# Patient Record
Sex: Male | Born: 1990 | Race: Black or African American | Hispanic: No | Marital: Single | Smoking: Current every day smoker
Health system: Southern US, Community
[De-identification: ages and names within clinical notes are randomized; demographics above are authoritative.]

---
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
## 2010-07-07 LAB — URINE CULTURE
Colony Count: NO GROWTH
Culture  Setup Time: 201109031130
Culture: NO GROWTH

## 2010-07-07 LAB — COMPREHENSIVE METABOLIC PANEL
ALT: 28 U/L (ref 0–53)
AST: 24 U/L (ref 0–37)
Albumin: 4.1 g/dL (ref 3.5–5.2)
Alkaline Phosphatase: 135 U/L — ABNORMAL HIGH (ref 39–117)
BUN: 7 mg/dL (ref 6–23)
CO2: 28 mEq/L (ref 19–32)
Calcium: 9.4 mg/dL (ref 8.4–10.5)
Chloride: 104 mEq/L (ref 96–112)
Creatinine, Ser: 0.9 mg/dL (ref 0.4–1.5)
GFR calc Af Amer: >60 mL/min (ref >60)
GFR calc non Af Amer: >60 mL/min (ref >60)
Glucose, Bld: 97 mg/dL (ref 70–99)
Potassium: 3.7 mEq/L (ref 3.5–5.1)
Sodium: 139 mEq/L (ref 135–145)
Total Bilirubin: 0.3 mg/dL (ref 0.3–1.2)
Total Protein: 6.7 g/dL (ref 6.0–8.3)

## 2010-07-07 LAB — DIFFERENTIAL
Basophils Absolute: 0 10*3/uL (ref 0.0–0.1)
Basophils Relative: 0 % (ref 0–1)
Eosinophils Absolute: 0.1 10*3/uL (ref 0.0–0.7)
Eosinophils Relative: 2 % (ref 0–5)
Lymphocytes Relative: 52 % — ABNORMAL HIGH (ref 12–46)
Lymphs Abs: 4.2 10*3/uL — ABNORMAL HIGH (ref 0.7–4.0)
Monocytes Absolute: 0.5 10*3/uL (ref 0.1–1.0)
Monocytes Relative: 6 % (ref 3–12)
Neutro Abs: 3.2 10*3/uL (ref 1.7–7.7)
Neutrophils Relative %: 40 % — ABNORMAL LOW (ref 43–77)

## 2010-07-07 LAB — CBC
HCT: 42.7 % (ref 39.0–52.0)
Hemoglobin: 14.3 g/dL (ref 13.0–17.0)
MCH: 31 pg (ref 26.0–34.0)
MCHC: 33.5 g/dL (ref 30.0–36.0)
MCV: 92.4 fL (ref 78.0–100.0)
Platelets: 356 10*3/uL (ref 150–400)
RBC: 4.62 MIL/uL (ref 4.22–5.81)
RDW: 13.3 % (ref 11.5–15.5)
WBC: 8.1 10*3/uL (ref 4.0–10.5)

## 2010-07-07 LAB — URINALYSIS, ROUTINE W REFLEX MICROSCOPIC
Bilirubin Urine: NEGATIVE
Glucose, UA: NEGATIVE mg/dL
Hgb urine dipstick: NEGATIVE
Ketones, ur: NEGATIVE mg/dL
Nitrite: NEGATIVE
Protein, ur: NEGATIVE mg/dL
Specific Gravity, Urine: 1.03 (ref 1.005–1.030)
Urobilinogen, UA: 1 mg/dL (ref 0.0–1.0)
pH: 6 (ref 5.0–8.0)

## 2010-07-07 LAB — LIPASE, BLOOD: Lipase: 21 U/L (ref 11–59)

## 2010-07-07 LAB — URINE MICROSCOPIC-ADD ON

## 2011-06-21 ENCOUNTER — Emergency Department (HOSPITAL_COMMUNITY)
Admission: EM | Admit: 2011-06-21 | Discharge: 2011-06-21 | Disposition: A | Discharge status: Home / Self Care | Payer: Medicaid Other | Attending: Emergency Medicine | Admitting: Emergency Medicine

## 2011-06-21 ENCOUNTER — Encounter (HOSPITAL_COMMUNITY): Payer: Self-pay | Admitting: *Deleted

## 2011-06-21 ENCOUNTER — Emergency Department (HOSPITAL_COMMUNITY): Payer: Medicaid Other

## 2011-06-21 DIAGNOSIS — J020 Streptococcal pharyngitis: Secondary | ICD-10-CM | POA: Insufficient documentation

## 2011-06-21 DIAGNOSIS — F17200 Nicotine dependence, unspecified, uncomplicated: Secondary | ICD-10-CM | POA: Insufficient documentation

## 2011-06-21 DIAGNOSIS — J0300 Acute streptococcal tonsillitis, unspecified: Secondary | ICD-10-CM

## 2011-06-21 LAB — DIFFERENTIAL
Basophils Absolute: 0.1 10*3/uL (ref 0.0–0.1)
Basophils Relative: 1 % (ref 0–1)
Eosinophils Absolute: 0.1 10*3/uL (ref 0.0–0.7)
Eosinophils Relative: 1 % (ref 0–5)
Lymphocytes Relative: 24 % (ref 12–46)
Lymphs Abs: 2.6 10*3/uL (ref 0.7–4.0)
Monocytes Absolute: 0.7 10*3/uL (ref 0.1–1.0)
Monocytes Relative: 6 % (ref 3–12)
Neutro Abs: 7.4 10*3/uL (ref 1.7–7.7)
Neutrophils Relative %: 68 % (ref 43–77)

## 2011-06-21 LAB — CBC
HCT: 44.3 % (ref 39.0–52.0)
Hemoglobin: 15.2 g/dL (ref 13.0–17.0)
MCH: 31.6 pg (ref 26.0–34.0)
MCHC: 34.3 g/dL (ref 30.0–36.0)
MCV: 92.1 fL (ref 78.0–100.0)
Platelets: 418 10*3/uL — ABNORMAL HIGH (ref 150–400)
RBC: 4.81 MIL/uL (ref 4.22–5.81)
RDW: 13.2 % (ref 11.5–15.5)
WBC: 10.8 10*3/uL — ABNORMAL HIGH (ref 4.0–10.5)

## 2011-06-21 LAB — RAPID STREP SCREEN (MED CTR MEBANE ONLY): Streptococcus, Group A Screen (Direct): POSITIVE — AB

## 2011-06-21 MED ORDER — PENICILLIN G BENZATHINE 1200000 UNIT/2ML IM SUSP
1.2000 10*6.[IU] | INTRAMUSCULAR | Status: AC
  Administered 2011-06-21: 1.2 10*6.[IU] via INTRAMUSCULAR
  Filled 2011-06-21: qty 2

## 2011-06-21 MED ORDER — HYDROCODONE-ACETAMINOPHEN 7.5-500 MG/15ML PO SOLN: 10.0000 mL | Freq: Four times a day (QID) | ORAL | Status: AC | PRN

## 2011-06-21 MED ORDER — HYDROCODONE-ACETAMINOPHEN 5-325 MG PO TABS
1.0000 | ORAL_TABLET | Freq: Once | ORAL | Status: AC
  Administered 2011-06-21: 1 via ORAL
  Filled 2011-06-21: qty 1

## 2011-06-21 MED ORDER — IOHEXOL 300 MG/ML  SOLN
80.0000 mL | Freq: Once | INTRAMUSCULAR | Status: AC | PRN
  Administered 2011-06-21: 80 mL via INTRAVENOUS

## 2011-06-21 NOTE — Discharge Instructions (Signed)
If you have any difficulty swallowing or breathing, return immediately to the Emergency Department.  Drink plenty of fluids to stay well hydrated and use the pain medication as prescribed.  You may take ibuprofen in addition to the prescribed pain medication.  You may return to the ER at any time for worsening condition or any new symptoms that concern you.    Strep Throat Strep throat is an infection of the throat caused by a bacteria named Streptococcus pyogenes. Your caregiver may call the infection streptococcal "tonsillitis" or "pharyngitis" depending on whether there are signs of inflammation in the tonsils or back of the throat. Strep throat is most common in children from 71 to 38 years old during the cold months of the year, but it can occur in people of any age during any season. This infection is spread from person to person (contagious) through coughing, sneezing, or other close contact. SYMPTOMS   Fever or chills.   Painful, swollen, red tonsils or throat.   Pain or difficulty when swallowing.   White or yellow spots on the tonsils or throat.   Swollen, tender lymph nodes or "glands" of the neck or under the jaw.   Red rash all over the body (rare).  DIAGNOSIS  Many different infections can cause the same symptoms. A test must be done to confirm the diagnosis so the right treatment can be given. A "rapid strep test" can help your caregiver make the diagnosis in a few minutes. If this test is not available, a light swab of the infected area can be used for a throat culture test. If a throat culture test is done, results are usually available in a day or two. TREATMENT  Strep throat is treated with antibiotic medicine. HOME CARE INSTRUCTIONS   Gargle with 1 tsp of salt in 1 cup of warm water, 3 to 4 times per day or as needed for comfort.   Family members who also have a sore throat or fever should be tested for strep throat and treated with antibiotics if they have the strep  infection.   Make sure everyone in your household washes their hands well.   Do not share food, drinking cups, or personal items that could cause the infection to spread to others.   You may need to eat a soft food diet until your sore throat gets better.   Drink enough water and fluids to keep your urine clear or pale yellow. This will help prevent dehydration.   Get plenty of rest.   Stay home from school, daycare, or work until you have been on antibiotics for 24 hours.   Only take over-the-counter or prescription medicines for pain, discomfort, or fever as directed by your caregiver.   If antibiotics are prescribed, take them as directed. Finish them even if you start to feel better.  SEEK MEDICAL CARE IF:   The glands in your neck continue to enlarge.   You develop a rash, cough, or earache.   You cough up green, yellow-brown, or bloody sputum.   You have pain or discomfort not controlled by medicines.   Your problems seem to be getting worse rather than better.  SEEK IMMEDIATE MEDICAL CARE IF:   You develop any new symptoms such as vomiting, severe headache, stiff or painful neck, chest pain, shortness of breath, or trouble swallowing.   You develop severe throat pain, drooling, or changes in your voice.   You develop swelling of the neck, or the skin on the  neck becomes red and tender.   You have a fever.   You develop signs of dehydration, such as fatigue, dry mouth, and decreased urination.   You become increasingly sleepy, or you cannot wake up completely.  Document Released: 04/07/2000 Document Revised: 12/21/2010 Document Reviewed: 06/09/2010 [facility] Patient Information 2012 [place], [place].Salt Water Gargle This solution will help make your mouth and throat feel better. HOME CARE INSTRUCTIONS   Mix 1 teaspoon of salt in 8 ounces of warm water.   Gargle with this solution as much or often as you need or as directed. Swish and gargle gently if you have any  sores or wounds in your mouth.   Do not swallow this mixture.  Document Released: 01/13/2004 Document Revised: 12/21/2010 Document Reviewed: 06/05/2008 [facility] Patient Information 2012 [place], [place].  RESOURCE GUIDE  Dental Problems  Patients with Medicaid: [facility] [phone]                                           [id number] [employer] Phone:  [phone]                                                  Phone:  [phone]  If unable to pay or uninsured, contact:  [facility] or [facility]. to become qualified for the adult dental clinic.  Chronic Pain Problems Contact [facility]  [phone] Patients need to be referred by their primary care doctor.  Insufficient Money for Medicine Contact [employer]:  call "211" or [facility] [phone].  No Primary Care Doctor Call [facility]  [phone] Other agencies that provide inexpensive medical care    [facility]  [phone]    [facility]  [phone]    [facility]  [phone]    [facility]  [phone]    [facility]  [phone]    [facility]  [phone]  Psychological Services [facility]  [phone] [facility]  [phone] [facility]   [phone] (emergency services [phone])  Substance Abuse Resources [facility]  [phone] [facility] [phone] [facility] [phone] Floydene Flock [phone] Residential & Outpatient Substance Abuse Program  [phone]  Abuse/Neglect [facility] Child Abuse Hotline [phone] [facility] Child Abuse Hotline (769)328-5847 (After Hours)  Emergency Shelter [facility] [phone]  [facility] [place] [facility] [phone] [facility] [phone]  MRSA Hotline #:    9382745752    [facility] Resources  [facility]     [employer]                          [facility] Dept. [address] [place]                       [address]      [address]  [address]  Sela Hua Phone:  [phone]                                   Phone:  [phone]                 Phone:  [place] Phone:  [place] [phone] (After Hours)

## 2011-06-21 NOTE — ED Provider Notes (Signed)
History     CSN: [id number]  Arrival date & time 06/21/11  1658   First MD Initiated Contact with Patient 06/21/11 1802      Chief Complaint  Patient presents with  . Sore Throat    (Consider location/radiation/quality/duration/timing/severity/associated sxs/prior treatment) HPI Comments: The patient presents with a 6 day history of throat pain. He describes having a sore throat, body aches, and subjective fever starting 6 days ago which was present for about 3 days and then subsided. This morning the patient woke up with throat pain that he describes as worse than the previous episode. He reports pain with swallowing and has been eating and drinking per usual. He denies current body aches, fever/chills, ear pain, difficulty breathing, and cough. He denies any sick contacts.   Patient is a 21 y.o. male presenting with pharyngitis. The history is provided by the patient.  Sore Throat Associated symptoms include a fever.    History reviewed. No pertinent past medical history.  History reviewed. No pertinent past surgical history.  History reviewed. No pertinent family history.  History  Substance Use Topics  . Smoking status: Current Everyday Smoker    Types: Cigarettes  . Smokeless tobacco: Not on file  . Alcohol Use: Yes      Review of Systems  Constitutional: Positive for fever.  HENT: Negative for trouble swallowing.   Respiratory: Negative for shortness of breath and stridor.     Allergies  Review of patient's allergies indicates no known allergies.  Home Medications  No current outpatient prescriptions on file.  BP 152/95  Pulse 95  Temp(Src) 98.4 F (36.9 C) (Oral)  Resp 16  SpO2 97%  Physical Exam  Nursing note and vitals reviewed. Constitutional: He is oriented to person, place, and time. He appears well-developed and well-nourished.  HENT:  Head: Normocephalic and atraumatic.  Mouth/Throat: Uvula is midline. Mucous membranes are not dry. No uvula  swelling. Posterior oropharyngeal edema present. No oropharyngeal exudate or tonsillar abscesses.    Neck: Trachea normal, normal range of motion and phonation normal. Neck supple. No tracheal tenderness present. No rigidity. No tracheal deviation and normal range of motion present.  Cardiovascular: Normal rate and regular rhythm.   Pulmonary/Chest: Effort normal and breath sounds normal. No stridor.  Lymphadenopathy:    He has cervical adenopathy.  Neurological: He is alert and oriented to person, place, and time.    ED Course  Procedures (including critical care time)  Labs Reviewed  CBC - Abnormal; Notable for the following:    WBC 10.8 (*)    Platelets 418 (*)    All other components within normal limits  RAPID STREP SCREEN - Abnormal; Notable for the following:    Streptococcus, Group A Screen (Direct) POSITIVE (*)    All other components within normal limits  DIFFERENTIAL   Ct Soft Tissue Neck W Contrast  06/21/2011  *RADIOLOGY REPORT*  Clinical Data: 21 year old male with throat swelling, pain, fever.  CT NECK WITH CONTRAST  Technique:  Multidetector CT imaging of the neck was performed with intravenous contrast.  Contrast: 80mL OMNIPAQUE IOHEXOL 300 MG/ML IJ SOLN  Comparison: None.  Findings: Area of maximum pain marked on the skin surface, seen on series 3 image 68 correspond to the right lower anterior neck at the level of the medial sternocleidomastoid muscle.  The adenoids are hyperenhancing.  There is asymmetric enlargement of the right tonsillar pillar which is very indistinct.  There is inflammatory stranding in the right parapharyngeal space.  No  discrete tonsillar fluid collection is identified.  Symmetric low density areas that the adenoids (image 24) likely reflects retained secretions.  The left parapharyngeal space is normal and the left tonsillar pillar has a more normal appearance.  There is no retropharyngeal fluid.  Sublingual space and submandibular glands are  within normal limits.  There is an asymmetric appearance of the right hypopharynx, piriform recess but I favor this reflects retained secretions.  The epiglottis otherwise is within normal limits.  The subglottic airway and thyroid are within normal limits.  The visualized superior mediastinum is unremarkable.  Lung apices are clear.  Cervical lymph nodes remain relatively symmetric.  Left level II nodes of the largest at 12 mm in short axis.  Right level IIB nodes are mildly increased up to 8 mm in short axis when compared to the left side.  Parotid glands, visualized brain parenchyma, orbits, and major vascular structures including the right ICA and internal jugular vein are patent.  There is tortuosity of the right ICA just below the skull base (coronal image 38).  No acute osseous abnormality identified.  No acute to dentition abnormality identified.  Minor right maxillary sinus mucosal thickening.  Other Visualized paranasal sinuses and mastoids are clear.  IMPRESSION: 1.  Abnormal right tonsil and right parapharyngeal space most likely representing severe/complicated acute tonsilitis in this age group. 2.  However, no tonsillar abscess or drainable fluid collection is identified. 3.  Relatively mild reactive lymphadenopathy.  Study discussed by telephone with provider Trixie Dredge in the ED on 06/21/2011 at 2020 hours.  Original Report Authenticated By: Harley Hallmark, M.D.   8:50 PM Discussed patient and CT results with Dr Juleen China.  Patient is comfortable appearing, handling his secretions, in no distress.  Plan is for d/c home.    1. Strep tonsillitis       MDM  Nontoxic afebrile patient with asymmetric tonsillar enlargement, strep test is positive, CT shows no abscess.  Pt treated in ED with bicillin and pain medication, d/c home with pain medication and precautions/reasons for immediate return.  Patient verbalizes understanding and agrees with plan.         Dillard Cannon [place], [place] 06/21/11 2054

## 2011-06-21 NOTE — ED Notes (Signed)
Reports sore throat that started last night. Airway intact, resp e/u.

## 2011-06-21 NOTE — ED Notes (Signed)
Pt denies any questions upon discharge. 

## 2011-06-22 LAB — POCT I-STAT, CHEM 8
BUN: 7 mg/dL (ref 6–23)
Calcium, Ion: 1.25 mmol/L (ref 1.12–1.32)
Chloride: 104 mEq/L (ref 96–112)
Creatinine, Ser: 1.1 mg/dL (ref 0.50–1.35)
Glucose, Bld: 101 mg/dL — ABNORMAL HIGH (ref 70–99)
HCT: 48 % (ref 39.0–52.0)
Hemoglobin: 16.3 g/dL (ref 13.0–17.0)
Potassium: 4.1 mEq/L (ref 3.5–5.1)
Sodium: 141 mEq/L (ref 135–145)
TCO2: 27 mmol/L (ref 0–100)

## 2011-06-22 NOTE — ED Provider Notes (Signed)
Medical screening examination/treatment/procedure(s) were performed by non-physician practitioner and as supervising physician I was immediately available for consultation/collaboration.  Gerhard Munch, MD 06/22/11 [phone]

## 2014-04-09 ENCOUNTER — Inpatient Hospital Stay (HOSPITAL_COMMUNITY): Payer: Self-pay

## 2014-04-09 ENCOUNTER — Emergency Department (HOSPITAL_COMMUNITY): Payer: Self-pay

## 2014-04-09 ENCOUNTER — Inpatient Hospital Stay (HOSPITAL_COMMUNITY)
Admission: EM | Admit: 2014-04-09 | Discharge: 2014-04-13 | DRG: 157 | Disposition: A | Discharge status: Home / Self Care | Payer: Self-pay | Attending: Cardiothoracic Surgery | Admitting: Cardiothoracic Surgery

## 2014-04-09 ENCOUNTER — Encounter (HOSPITAL_COMMUNITY): Payer: Self-pay | Admitting: Emergency Medicine

## 2014-04-09 DIAGNOSIS — S02609A Fracture of mandible, unspecified, initial encounter for closed fracture: Principal | ICD-10-CM | POA: Diagnosis present

## 2014-04-09 DIAGNOSIS — W3400XA Accidental discharge from unspecified firearms or gun, initial encounter: Secondary | ICD-10-CM

## 2014-04-09 DIAGNOSIS — S42351A Displaced comminuted fracture of shaft of humerus, right arm, initial encounter for closed fracture: Secondary | ICD-10-CM | POA: Diagnosis present

## 2014-04-09 DIAGNOSIS — S128XXA Fracture of other parts of neck, initial encounter: Secondary | ICD-10-CM | POA: Diagnosis present

## 2014-04-09 DIAGNOSIS — S42101A Fracture of unspecified part of scapula, right shoulder, initial encounter for closed fracture: Secondary | ICD-10-CM | POA: Diagnosis present

## 2014-04-09 DIAGNOSIS — Y249XXA Unspecified firearm discharge, undetermined intent, initial encounter: Secondary | ICD-10-CM

## 2014-04-09 DIAGNOSIS — D62 Acute posthemorrhagic anemia: Secondary | ICD-10-CM | POA: Diagnosis not present

## 2014-04-09 DIAGNOSIS — S42301A Unspecified fracture of shaft of humerus, right arm, initial encounter for closed fracture: Secondary | ICD-10-CM | POA: Diagnosis present

## 2014-04-09 DIAGNOSIS — T148XXA Other injury of unspecified body region, initial encounter: Secondary | ICD-10-CM

## 2014-04-09 LAB — PREPARE FRESH FROZEN PLASMA: Unit division: 0

## 2014-04-09 LAB — BLOOD GAS, ARTERIAL
ACID-BASE DEFICIT: 3.5 mmol/L — AB (ref 0.0–2.0)
BICARBONATE: 20.7 meq/L (ref 20.0–24.0)
Drawn by: 39898
FIO2: 0.5 %
MECHVT: 660 mL
O2 Saturation: 99.1 %
PATIENT TEMPERATURE: 98.6
PEEP: 5 cmH2O
PO2 ART: 209 mmHg — AB (ref 80.0–100.0)
RATE: 16 resp/min
TCO2: 21.8 mmol/L (ref 0–100)
pCO2 arterial: 35.6 mmHg (ref 35.0–45.0)
pH, Arterial: 7.383 (ref 7.350–7.450)

## 2014-04-09 LAB — CBC
HEMATOCRIT: 41.2 % (ref 39.0–52.0)
Hemoglobin: 13.9 g/dL (ref 13.0–17.0)
MCH: 31.3 pg (ref 26.0–34.0)
MCHC: 33.7 g/dL (ref 30.0–36.0)
MCV: 92.8 fL (ref 78.0–100.0)
PLATELETS: 313 10*3/uL (ref 150–400)
RBC: 4.44 MIL/uL (ref 4.22–5.81)
RDW: 13.3 % (ref 11.5–15.5)
WBC: 9.1 10*3/uL (ref 4.0–10.5)

## 2014-04-09 LAB — URINE MICROSCOPIC-ADD ON

## 2014-04-09 LAB — I-STAT CHEM 8, ED
BUN: 10 mg/dL (ref 6–23)
CALCIUM ION: 1.12 mmol/L (ref 1.12–1.23)
Chloride: 105 mEq/L (ref 96–112)
Creatinine, Ser: 1.4 mg/dL — ABNORMAL HIGH (ref 0.50–1.35)
Glucose, Bld: 120 mg/dL — ABNORMAL HIGH (ref 70–99)
HEMATOCRIT: 46 % (ref 39.0–52.0)
HEMOGLOBIN: 15.6 g/dL (ref 13.0–17.0)
Potassium: 3.7 mEq/L (ref 3.7–5.3)
SODIUM: 138 meq/L (ref 137–147)
TCO2: 17 mmol/L (ref 0–100)

## 2014-04-09 LAB — ABO/RH: ABO/RH(D): O POS

## 2014-04-09 LAB — COMPREHENSIVE METABOLIC PANEL
ALBUMIN: 4 g/dL (ref 3.5–5.2)
ALT: 27 U/L (ref 0–53)
AST: 31 U/L (ref 0–37)
Alkaline Phosphatase: 119 U/L — ABNORMAL HIGH (ref 39–117)
Anion gap: 22 — ABNORMAL HIGH (ref 5–15)
BUN: 11 mg/dL (ref 6–23)
CALCIUM: 9.7 mg/dL (ref 8.4–10.5)
CHLORIDE: 101 meq/L (ref 96–112)
CO2: 18 mEq/L — ABNORMAL LOW (ref 19–32)
CREATININE: 1.17 mg/dL (ref 0.50–1.35)
GFR calc Af Amer: >90 mL/min (ref >90)
GFR calc non Af Amer: 87 mL/min — ABNORMAL LOW (ref >90)
Glucose, Bld: 115 mg/dL — ABNORMAL HIGH (ref 70–99)
Potassium: 4.1 mEq/L (ref 3.7–5.3)
Sodium: 141 mEq/L (ref 137–147)
Total Bilirubin: <0.2 mg/dL — ABNORMAL LOW (ref 0.3–1.2)
Total Protein: 7.4 g/dL (ref 6.0–8.3)

## 2014-04-09 LAB — URINALYSIS, ROUTINE W REFLEX MICROSCOPIC
BILIRUBIN URINE: NEGATIVE
Glucose, UA: NEGATIVE mg/dL
Ketones, ur: NEGATIVE mg/dL
Leukocytes, UA: NEGATIVE
Nitrite: NEGATIVE
PROTEIN: NEGATIVE mg/dL
Specific Gravity, Urine: 1.024 (ref 1.005–1.030)
Urobilinogen, UA: 0.2 mg/dL (ref 0.0–1.0)
pH: 7 (ref 5.0–8.0)

## 2014-04-09 LAB — ETHANOL: Alcohol, Ethyl (B): 109 mg/dL — ABNORMAL HIGH (ref 0–11)

## 2014-04-09 LAB — PROTIME-INR
INR: 0.94 (ref 0.00–1.49)
Prothrombin Time: 12.6 seconds (ref 11.6–15.2)

## 2014-04-09 LAB — I-STAT CG4 LACTIC ACID, ED: LACTIC ACID, VENOUS: 4.37 mmol/L — AB (ref 0.5–2.2)

## 2014-04-09 MED ORDER — CHLORHEXIDINE GLUCONATE 0.12 % MT SOLN
15.0000 mL | Freq: Two times a day (BID) | OROMUCOSAL | Status: DC
Start: 2014-04-09 — End: 2014-04-10

## 2014-04-09 MED ORDER — KCL IN DEXTROSE-NACL 20-5-0.45 MEQ/L-%-% IV SOLN
INTRAVENOUS | Status: DC
  Administered 2014-04-10: via INTRAVENOUS
  Administered 2014-04-10: 100 mL/h via INTRAVENOUS
  Filled 2014-04-09 (×4): qty 1000

## 2014-04-09 MED ORDER — SODIUM CHLORIDE 0.9 % IV SOLN
INTRAVENOUS | Status: AC | PRN
  Administered 2014-04-09 (×2): 1000 mL via INTRAVENOUS

## 2014-04-09 MED ORDER — FENTANYL CITRATE 0.05 MG/ML IJ SOLN
INTRAMUSCULAR | Status: AC
  Filled 2014-04-09: qty 2

## 2014-04-09 MED ORDER — MIDAZOLAM HCL 5 MG/5ML IJ SOLN
INTRAMUSCULAR | Status: AC | PRN
  Administered 2014-04-09: 5 mg via INTRAVENOUS

## 2014-04-09 MED ORDER — ROCURONIUM BROMIDE 50 MG/5ML IV SOLN
INTRAVENOUS | Status: AC | PRN
  Administered 2014-04-09: 100 mg via INTRAVENOUS

## 2014-04-09 MED ORDER — MIDAZOLAM HCL 2 MG/2ML IJ SOLN
INTRAMUSCULAR | Status: AC
  Filled 2014-04-09: qty 6

## 2014-04-09 MED ORDER — PROPOFOL 10 MG/ML IV EMUL
INTRAVENOUS | Status: AC
  Filled 2014-04-09: qty 100

## 2014-04-09 MED ORDER — ONDANSETRON HCL 4 MG/2ML IJ SOLN: 4.0000 mg | Freq: Four times a day (QID) | INTRAMUSCULAR | Status: DC | PRN

## 2014-04-09 MED ORDER — ONDANSETRON HCL 4 MG PO TABS: 4.0000 mg | ORAL_TABLET | Freq: Four times a day (QID) | ORAL | Status: DC | PRN

## 2014-04-09 MED ORDER — ETOMIDATE 2 MG/ML IV SOLN
INTRAVENOUS | Status: AC | PRN
  Administered 2014-04-09: 20 mg via INTRAVENOUS

## 2014-04-09 MED ORDER — PROPOFOL 10 MG/ML IV EMUL
INTRAVENOUS | Status: AC
  Administered 2014-04-09: 1000 mg
  Filled 2014-04-09: qty 100

## 2014-04-09 MED ORDER — SUCCINYLCHOLINE CHLORIDE 20 MG/ML IJ SOLN
INTRAMUSCULAR | Status: AC | PRN
  Administered 2014-04-09: 100 mg via INTRAVENOUS

## 2014-04-09 MED ORDER — KETAMINE HCL 10 MG/ML IJ SOLN
1.0000 mg/kg | Freq: Once | INTRAMUSCULAR | Status: DC
  Filled 2014-04-09: qty 8

## 2014-04-09 MED ORDER — CEFAZOLIN SODIUM-DEXTROSE 2-3 GM-% IV SOLR
INTRAVENOUS | Status: AC
  Administered 2014-04-09: 2000 mg
  Filled 2014-04-09: qty 50

## 2014-04-09 MED ORDER — FENTANYL CITRATE 0.05 MG/ML IJ SOLN
100.0000 ug | INTRAMUSCULAR | Status: DC | PRN
  Administered 2014-04-10 (×2): 100 ug via INTRAVENOUS
  Filled 2014-04-09 (×3): qty 2

## 2014-04-09 MED ORDER — FENTANYL CITRATE 0.05 MG/ML IJ SOLN
INTRAMUSCULAR | Status: AC | PRN
  Administered 2014-04-09 (×2): 100 ug via INTRAVENOUS

## 2014-04-09 MED ORDER — IOHEXOL 300 MG/ML  SOLN
100.0000 mL | Freq: Once | INTRAMUSCULAR | Status: AC | PRN
Start: 2014-04-09 — End: 2014-04-09
  Administered 2014-04-09: 100 mL via INTRAVENOUS

## 2014-04-09 MED ORDER — CETYLPYRIDINIUM CHLORIDE 0.05 % MT LIQD
7.0000 mL | Freq: Four times a day (QID) | OROMUCOSAL | Status: DC
  Administered 2014-04-10: 7 mL via OROMUCOSAL

## 2014-04-09 MED ORDER — ONDANSETRON HCL 4 MG/2ML IJ SOLN
4.0000 mg | Freq: Once | INTRAMUSCULAR | Status: AC
  Administered 2014-04-09: 4 mg via INTRAVENOUS

## 2014-04-09 MED ORDER — PROPOFOL 10 MG/ML IV EMUL
0.0000 ug/kg/min | INTRAVENOUS | Status: DC
  Administered 2014-04-10: 50 ug/kg/min via INTRAVENOUS
  Filled 2014-04-09: qty 100

## 2014-04-09 NOTE — ED Notes (Signed)
Patient transported to CT 
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
## 2014-04-10 ENCOUNTER — Inpatient Hospital Stay (HOSPITAL_COMMUNITY): Payer: Self-pay

## 2014-04-10 DIAGNOSIS — S42101A Fracture of unspecified part of scapula, right shoulder, initial encounter for closed fracture: Secondary | ICD-10-CM | POA: Diagnosis present

## 2014-04-10 DIAGNOSIS — S42301A Unspecified fracture of shaft of humerus, right arm, initial encounter for closed fracture: Secondary | ICD-10-CM | POA: Diagnosis present

## 2014-04-10 DIAGNOSIS — S128XXA Fracture of other parts of neck, initial encounter: Secondary | ICD-10-CM | POA: Diagnosis present

## 2014-04-10 LAB — BASIC METABOLIC PANEL
Anion gap: 13 (ref 5–15)
BUN: 8 mg/dL (ref 6–23)
CO2: 24 mEq/L (ref 19–32)
Calcium: 8.9 mg/dL (ref 8.4–10.5)
Chloride: 101 mEq/L (ref 96–112)
Creatinine, Ser: 1 mg/dL (ref 0.50–1.35)
Glucose, Bld: 112 mg/dL — ABNORMAL HIGH (ref 70–99)
POTASSIUM: 4.1 meq/L (ref 3.7–5.3)
SODIUM: 138 meq/L (ref 137–147)

## 2014-04-10 LAB — TYPE AND SCREEN
ABO/RH(D): O POS
Antibody Screen: NEGATIVE
UNIT DIVISION: 0
Unit division: 0

## 2014-04-10 LAB — CBC
HCT: 39.2 % (ref 39.0–52.0)
Hemoglobin: 12.9 g/dL — ABNORMAL LOW (ref 13.0–17.0)
MCH: 30.6 pg (ref 26.0–34.0)
MCHC: 32.9 g/dL (ref 30.0–36.0)
MCV: 92.9 fL (ref 78.0–100.0)
Platelets: 306 10*3/uL (ref 150–400)
RBC: 4.22 MIL/uL (ref 4.22–5.81)
RDW: 13.6 % (ref 11.5–15.5)
WBC: 9.6 10*3/uL (ref 4.0–10.5)

## 2014-04-10 LAB — TRIGLYCERIDES: Triglycerides: 346 mg/dL — ABNORMAL HIGH (ref <150)

## 2014-04-10 LAB — CDS SEROLOGY

## 2014-04-10 LAB — MRSA PCR SCREENING: MRSA BY PCR: NEGATIVE

## 2014-04-10 MED ORDER — KCL IN DEXTROSE-NACL 20-5-0.45 MEQ/L-%-% IV SOLN
INTRAVENOUS | Status: DC
  Administered 2014-04-11: 15:00:00 via INTRAVENOUS
  Filled 2014-04-10 (×7): qty 1000

## 2014-04-10 MED ORDER — MORPHINE SULFATE 2 MG/ML IJ SOLN
2.0000 mg | INTRAMUSCULAR | Status: DC | PRN
  Administered 2014-04-10: 2 mg via INTRAVENOUS
  Administered 2014-04-11: 4 mg via INTRAVENOUS
  Administered 2014-04-11: 2 mg via INTRAVENOUS
  Administered 2014-04-11 – 2014-04-12 (×7): 4 mg via INTRAVENOUS
  Filled 2014-04-10 (×4): qty 2
  Filled 2014-04-10 (×2): qty 1
  Filled 2014-04-10 (×2): qty 2
  Filled 2014-04-10: qty 1
  Filled 2014-04-10: qty 2
  Filled 2014-04-10 (×2): qty 1
  Filled 2014-04-10: qty 2
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
## 2014-04-10 NOTE — Progress Notes (Signed)
Pt self extubated. Dr. Janee Mornhompson notified. O2 SATs maintaining at 100%. No new orders given. Will continue to monitor.

## 2014-04-10 NOTE — Progress Notes (Signed)
Patient ID: William Molina, male   DOB: 09-10-1990, 23 y.o.   MRN: [id number] Notified by RN patient self extubated. On my arrival, sats 100% and no stridor. No SOB and denies trouble breathing. Voice is clear. Anesthesia also evaluated and he is doing well. C spine cleared. Will monitor. Violeta GelinasBurke Askia Hazelip, MD, MPH, FACS Trauma: [phone] General Surgery: [phone]
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
## 2014-04-10 NOTE — Progress Notes (Signed)
RT called to room due to pt self-extubating. RT arrived pt had been placed on Pender with no distress. No stridor. Vitals WNL. CRNA and MD notified. [id number]-MD at bedside. MD decided at this time not to re-intubate.

## 2014-04-11 MED ORDER — DM-GUAIFENESIN ER 30-600 MG PO TB12
1.0000 | ORAL_TABLET | Freq: Two times a day (BID) | ORAL | Status: DC | PRN
  Administered 2014-04-11: 1 via ORAL
  Filled 2014-04-11 (×2): qty 1

## 2014-04-11 MED ORDER — ENOXAPARIN SODIUM 40 MG/0.4ML ~~LOC~~ SOLN
40.0000 mg | SUBCUTANEOUS | Status: DC
  Administered 2014-04-11 – 2014-04-13 (×2): 40 mg via SUBCUTANEOUS
  Filled 2014-04-11 (×3): qty 0.4
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
## 2014-04-12 LAB — CBC
HCT: 32.1 % — ABNORMAL LOW (ref 39.0–52.0)
Hemoglobin: 10.7 g/dL — ABNORMAL LOW (ref 13.0–17.0)
MCH: 31.6 pg (ref 26.0–34.0)
MCHC: 33.3 g/dL (ref 30.0–36.0)
MCV: 94.7 fL (ref 78.0–100.0)
PLATELETS: 273 10*3/uL (ref 150–400)
RBC: 3.39 MIL/uL — AB (ref 4.22–5.81)
RDW: 12.9 % (ref 11.5–15.5)
WBC: 7.5 10*3/uL (ref 4.0–10.5)

## 2014-04-12 LAB — BASIC METABOLIC PANEL
ANION GAP: 14 (ref 5–15)
BUN: 5 mg/dL — ABNORMAL LOW (ref 6–23)
CO2: 25 mEq/L (ref 19–32)
Calcium: 9.1 mg/dL (ref 8.4–10.5)
Chloride: 96 mEq/L (ref 96–112)
Creatinine, Ser: 0.96 mg/dL (ref 0.50–1.35)
GFR calc Af Amer: >90 mL/min (ref >90)
GFR calc non Af Amer: >90 mL/min (ref >90)
Glucose, Bld: 100 mg/dL — ABNORMAL HIGH (ref 70–99)
Potassium: 4.1 mEq/L (ref 3.7–5.3)
SODIUM: 135 meq/L — AB (ref 137–147)

## 2014-04-12 MED ORDER — OXYCODONE-ACETAMINOPHEN 5-325 MG PO TABS
1.0000 | ORAL_TABLET | ORAL | Status: DC | PRN
  Administered 2014-04-12 – 2014-04-13 (×6): 2 via ORAL
  Filled 2014-04-12 (×6): qty 2
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
## 2014-04-12 NOTE — Progress Notes (Signed)
Orthopedic Tech Progress Note Patient Details:  William Molina [birth year] [id number] [employer] called for brace order. Spoke with Tammy SoursGreg to place order. Patient ID: William Molina, male   DOB: [birth year], 23 y.o.   MRN: [id number]   [name] 04/12/2014, 10:49 AM
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
## 2014-04-13 ENCOUNTER — Encounter (HOSPITAL_COMMUNITY): Payer: Self-pay | Admitting: Anesthesiology

## 2014-04-13 DIAGNOSIS — S02609A Fracture of mandible, unspecified, initial encounter for closed fracture: Secondary | ICD-10-CM | POA: Diagnosis present

## 2014-04-13 LAB — CBC
HCT: 31.7 % — ABNORMAL LOW (ref 39.0–52.0)
HEMOGLOBIN: 10.5 g/dL — AB (ref 13.0–17.0)
MCH: 30.8 pg (ref 26.0–34.0)
MCHC: 33.1 g/dL (ref 30.0–36.0)
MCV: 93 fL (ref 78.0–100.0)
Platelets: 305 10*3/uL (ref 150–400)
RBC: 3.41 MIL/uL — ABNORMAL LOW (ref 4.22–5.81)
RDW: 12.8 % (ref 11.5–15.5)
WBC: 6.6 10*3/uL (ref 4.0–10.5)

## 2014-04-13 MED ORDER — DEXTROSE-NACL 5-0.45 % IV SOLN
100.0000 mL/h | INTRAVENOUS | Status: DC
  Administered 2014-04-13: 100 mL/h via INTRAVENOUS

## 2014-04-13 MED ORDER — CEFAZOLIN SODIUM-DEXTROSE 2-3 GM-% IV SOLR
2.0000 g | INTRAVENOUS | Status: DC
  Filled 2014-04-13: qty 50

## 2014-04-13 MED ORDER — ACETAMINOPHEN 500 MG PO TABS
1000.0000 mg | ORAL_TABLET | Freq: Once | ORAL | Status: AC
  Administered 2014-04-13: 1000 mg via ORAL
  Filled 2014-04-13: qty 2

## 2014-04-13 MED ORDER — OXYCODONE-ACETAMINOPHEN 5-325 MG PO TABS: 1.0000 | ORAL_TABLET | Freq: Four times a day (QID) | ORAL | Status: DC | PRN

## 2014-04-13 NOTE — Discharge Instructions (Addendum)
Keep brace on arm at all times except for hygiene. No lifting with right arm.     NO CHEWING!  Full Liquid Diet A full liquid diet may be used:   To help you transition from a clear liquid diet to a soft diet.   When your body is healing and can only tolerate foods that are easy to digest.  Before or after certain a procedure, test, or surgery (such as stomach or intestinal surgeries).   If you have trouble swallowing or chewing.  A full liquid diet includes fluids and foods that are liquid or will become liquid at room temperature. The full liquid diet gives you the proteins, fluids, salts, and minerals that you need for energy. If you continue this diet for more than 72 hours, talk to your health care provider about how many calories you need to consume. If you continue the diet for more than 5 days, talk to your health care provider about taking a multivitamin or a nutritional supplement. WHAT DO I NEED TO KNOW ABOUT A FULL LIQUID DIET?  You may have any liquid.  You may have any food that becomes a liquid at room temperature. The food is considered a liquid if it can be poured off a spoon at room temperature.  Drink one serving of citrus or vitamin C-enriched fruit juice daily. WHAT FOODS CAN I EAT? Grains Any grain food that can be pureed in soup (such as crackers, pasta, and rice). Hot cereal (such as farina or oatmeal) that has been blended. Talk to your health care provider or dietitian about these foods. Vegetables Pulp-free tomato or vegetable juice. Vegetables pureed in soup.  Fruits Fruit juice, including nectars and juices with pulp. Meats and Other Protein Sources Eggs in custard, eggnog mix, and eggs used in ice cream or pudding. Strained meats, like in baby food, may be allowed. Consult your health care provider.  Dairy Milk and milk-based beverages, including milk shakes and instant breakfast mixes. Smooth yogurt. Pureed cottage cheese. Avoid these foods if they  are not well tolerated. Beverages All beverages, including liquid nutritional supplements. Ask your health care provider if you can have carbonated beverages. They may not be well tolerated. Condiments Iodized salt, pepper, spices, and flavorings. Cocoa powder. Vinegar, ketchup, yellow mustard, smooth sauces (such as hollandaise, cheese sauce, or white sauce), and soy sauce. Sweets and Desserts Custard, smooth pudding. Flavored gelatin. Tapioca, junket. Plain ice cream, sherbet, fruit ices. Frozen ice pops, frozen fudge pops, pudding pops, and other frozen bars with cream. Syrups, including chocolate syrup. Sugar, honey, jelly.  Fats and Oils Margarine, butter, cream, sour cream, and oils. Other Broth and cream soups. Strained, broth-based soups. The items listed above may not be a complete list of recommended foods or beverages. Contact your dietitian for more options.  WHAT FOODS CAN I NOT EAT? Grains All breads. Grains are not allowed unless they are pureed into soup. Vegetables Vegetables are not allowed unless they are juiced, or cooked and pureed into soup. Fruits Fruits are not allowed unless they are juiced. Meats and Other Protein Sources Any meat or fish. Cooked or raw eggs. Nut butters.  Dairy Cheese.  Condiments Stone ground mustards. Fats and Oils Fats that are coarse or chunky. Sweets and Desserts Ice cream or other frozen desserts that have any solids in them or on top, such as nuts, chocolate chips, and pieces of cookies. Cakes. Cookies. Candy. Others Soups with chunks or pieces in them. The items listed above  may not be a complete list of foods and beverages to avoid. Contact your dietitian for more information. Document Released: 04/10/2005 Document Revised: 04/15/2013 Document Reviewed: 02/13/2013 [facility] Patient Information 2015 [employer] [place] [place]. This information is not intended to replace advice given to you by your health care provider. Make sure you discuss any  questions you have with your health care provider.
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
## 2014-04-13 NOTE — Progress Notes (Signed)
Pt would like to speak to MD tomorrow about his arm. Pt stated that if MD thinks he should have surgery on his arm...the pt would like to move forward with the surgery instead of d/c home.

## 2014-04-13 NOTE — Progress Notes (Signed)
Patient was discharged home with AVS and prescription. He refused the wheelchair to take him outside, and walked out on his own.
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
## 2014-04-13 NOTE — Progress Notes (Signed)
Orthopedic Tech Progress Note Patient Details:  William Molina 09/13/1990 [id number]  Ortho Devices Type of Ortho Device: Arm sling Ortho Device/Splint Location: rue Ortho Device/Splint Interventions: Application As ordered by Dr. Elvera LennoxMurphy  Tassie Pollett 04/13/2014, 3:43 PM
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
## 2014-04-14 SURGERY — OPEN REDUCTION INTERNAL FIXATION (ORIF) PROXIMAL HUMERUS FRACTURE
Anesthesia: General | Laterality: Right

## 2014-04-15 ENCOUNTER — Encounter (HOSPITAL_COMMUNITY): Payer: Self-pay | Admitting: *Deleted

## 2016-01-28 ENCOUNTER — Encounter (HOSPITAL_COMMUNITY): Payer: Self-pay | Admitting: Emergency Medicine

## 2016-01-28 ENCOUNTER — Ambulatory Visit (HOSPITAL_COMMUNITY)
Admission: EM | Admit: 2016-01-28 | Discharge: 2016-01-28 | Disposition: A | Discharge status: Home / Self Care | Payer: Medicaid Other | Attending: Family Medicine | Admitting: Family Medicine

## 2016-01-28 DIAGNOSIS — Z202 Contact with and (suspected) exposure to infections with a predominantly sexual mode of transmission: Secondary | ICD-10-CM

## 2016-01-28 MED ORDER — METRONIDAZOLE 500 MG PO TABS
500.0000 mg | ORAL_TABLET | Freq: Two times a day (BID) | ORAL | 0 refills | Status: DC
Start: 2016-01-28 — End: 2021-05-26

## 2016-01-28 NOTE — ED Provider Notes (Signed)
CSN: [id number]     Arrival date & time 01/28/16  1028 History   First MD Initiated Contact with Patient 01/28/16 1044     Chief Complaint  Patient presents with  . Exposure to STD   (Consider location/radiation/quality/duration/timing/severity/associated sxs/prior Treatment) HPI Pt is advised partner disclosed confirmation of STD testing and has suggested patient be treated. Pt denies any symptoms at this time.   Exposure was about 2 weeks ago.    Denies: fever chills, previous STD    History reviewed. No pertinent past medical history. History reviewed. No pertinent surgical history. History reviewed. No pertinent family history. Social History  Substance Use Topics  . Smoking status: Never Smoker  . Smokeless tobacco: Never Used  . Alcohol use No    Review of Systems  Denies: HEADACHE, NAUSEA, ABDOMINAL PAIN, CHEST PAIN, CONGESTION, DYSURIA, SHORTNESS OF BREATH  Allergies  Review of patient's allergies indicates no known allergies.  Home Medications   Prior to Admission medications   Medication Sig Start Date End Date Taking? Authorizing Provider  metroNIDAZOLE (FLAGYL) 500 MG tablet Take 1 tablet (500 mg total) by mouth 2 (two) times daily. 01/28/16   Tharon AquasFrank C Patrick, PA  oxyCODONE-acetaminophen (PERCOCET/ROXICET) 5-325 MG per tablet Take 1-2 tablets by mouth every 6 (six) hours as needed for moderate pain. 04/13/14   Nonie HoyerMegan N Baird, PA-C   Meds Ordered and Administered this Visit  Medications - No data to display  BP 137/86 (BP Location: Left Arm)   Pulse 85   Temp 98.3 F (36.8 C) (Oral)   Resp 16   SpO2 100%  No data found.   Physical Exam NURSES NOTES AND VITAL SIGNS REVIEWED. CONSTITUTIONAL: Well developed, well nourished, no acute distress HEENT: normocephalic, atraumatic EYES: Conjunctiva normal NECK:normal ROM, supple, no adenopathy PULMONARY:No respiratory distress, normal effort ABDOMINAL: Soft, ND, NT BS+, No CVAT MUSCULOSKELETAL: Normal ROM of  all extremities,  SKIN: warm and dry without rash PSYCHIATRIC: Mood and affect, behavior are normal  Urgent Care Course   Clinical Course    Procedures (including critical care time)  Labs Review Labs Reviewed - No data to display  Imaging Review No results found.   Visual Acuity Review  Right Eye Distance:   Left Eye Distance:   Bilateral Distance:    Right Eye Near:   Left Eye Near:    Bilateral Near:         MDM   1. Exposure to STD     Patient is reassured that there are no issues that require transfer to higher level of care at this time or additional tests. Patient is advised to continue home symptomatic treatment. Patient is advised that if there are new or worsening symptoms to attend the emergency department, contact primary care provider, or return to UC. Instructions of care provided discharged home in stable condition.    THIS NOTE WAS GENERATED USING A VOICE RECOGNITION SOFTWARE PROGRAM. ALL REASONABLE EFFORTS  WERE MADE TO PROOFREAD THIS DOCUMENT FOR ACCURACY.  I have verbally reviewed the discharge instructions with the patient. A printed AVS was given to the patient.  All questions were answered prior to discharge.      Tharon AquasFrank C Patrick, PA 01/28/16 1121
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
## 2016-06-09 ENCOUNTER — Emergency Department (HOSPITAL_COMMUNITY)
Admission: EM | Admit: 2016-06-09 | Discharge: 2016-06-09 | Disposition: A | Discharge status: Home / Self Care | Payer: Self-pay | Attending: Emergency Medicine | Admitting: Emergency Medicine

## 2016-06-09 ENCOUNTER — Encounter (HOSPITAL_COMMUNITY): Payer: Self-pay | Admitting: Emergency Medicine

## 2016-06-09 DIAGNOSIS — S01511A Laceration without foreign body of lip, initial encounter: Secondary | ICD-10-CM | POA: Insufficient documentation

## 2016-06-09 DIAGNOSIS — Y939 Activity, unspecified: Secondary | ICD-10-CM | POA: Insufficient documentation

## 2016-06-09 DIAGNOSIS — Y999 Unspecified external cause status: Secondary | ICD-10-CM | POA: Insufficient documentation

## 2016-06-09 DIAGNOSIS — Y929 Unspecified place or not applicable: Secondary | ICD-10-CM | POA: Insufficient documentation

## 2016-06-09 MED ORDER — CEPHALEXIN 500 MG PO CAPS: 500.0000 mg | ORAL_CAPSULE | Freq: Four times a day (QID) | ORAL | 0 refills | Status: DC

## 2016-06-09 MED ORDER — LIDOCAINE-EPINEPHRINE (PF) 2 %-1:200000 IJ SOLN
10.0000 mL | Freq: Once | INTRAMUSCULAR | Status: AC
  Administered 2016-06-09: 10 mL
  Filled 2016-06-09: qty 20

## 2016-06-09 NOTE — Discharge Instructions (Signed)
Your sutures need to come out in 7 days.  Please monitor for signs of infection including redness, swelling, increased pain, discharge or fever.   Try to keep the area clean and wash with warm water and soap twice a day.    Take antibiotics as prescribed.
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
## 2016-06-09 NOTE — ED Notes (Signed)
PA at bedside suturing patient.

## 2018-09-23 ENCOUNTER — Emergency Department (HOSPITAL_COMMUNITY)
Admission: EM | Admit: 2018-09-23 | Discharge: 2018-09-23 | Disposition: A | Discharge status: Home / Self Care | Payer: No Typology Code available for payment source | Attending: Emergency Medicine | Admitting: Emergency Medicine

## 2018-09-23 ENCOUNTER — Encounter (HOSPITAL_COMMUNITY): Payer: Self-pay

## 2018-09-23 ENCOUNTER — Emergency Department (HOSPITAL_COMMUNITY): Payer: No Typology Code available for payment source

## 2018-09-23 ENCOUNTER — Other Ambulatory Visit: Payer: Self-pay

## 2018-09-23 DIAGNOSIS — Y999 Unspecified external cause status: Secondary | ICD-10-CM | POA: Insufficient documentation

## 2018-09-23 DIAGNOSIS — Y92410 Unspecified street and highway as the place of occurrence of the external cause: Secondary | ICD-10-CM | POA: Diagnosis not present

## 2018-09-23 DIAGNOSIS — M545 Low back pain, unspecified: Secondary | ICD-10-CM

## 2018-09-23 DIAGNOSIS — Y939 Activity, unspecified: Secondary | ICD-10-CM | POA: Insufficient documentation

## 2018-09-23 DIAGNOSIS — S161XXA Strain of muscle, fascia and tendon at neck level, initial encounter: Secondary | ICD-10-CM

## 2018-09-23 DIAGNOSIS — S199XXA Unspecified injury of neck, initial encounter: Secondary | ICD-10-CM | POA: Diagnosis present

## 2018-09-23 MED ORDER — METHOCARBAMOL 500 MG PO TABS: 500.0000 mg | ORAL_TABLET | Freq: Two times a day (BID) | ORAL | 0 refills | Status: DC
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
## 2018-09-23 NOTE — ED Notes (Signed)
Bed: WTR6 Expected date:  Expected time:  Means of arrival:  Comments: 

## 2018-09-23 NOTE — Discharge Instructions (Signed)
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
## 2021-05-26 ENCOUNTER — Encounter (HOSPITAL_COMMUNITY): Payer: Self-pay

## 2021-05-26 ENCOUNTER — Other Ambulatory Visit: Payer: Self-pay

## 2021-05-26 ENCOUNTER — Ambulatory Visit (HOSPITAL_COMMUNITY)
Admission: EM | Admit: 2021-05-26 | Discharge: 2021-05-26 | Disposition: A | Discharge status: Home / Self Care | Payer: Self-pay | Attending: Physician Assistant | Admitting: Physician Assistant

## 2021-05-26 DIAGNOSIS — K0889 Other specified disorders of teeth and supporting structures: Secondary | ICD-10-CM

## 2021-05-26 DIAGNOSIS — K047 Periapical abscess without sinus: Secondary | ICD-10-CM

## 2021-05-26 MED ORDER — AMOXICILLIN-POT CLAVULANATE 875-125 MG PO TABS: 1.0000 | ORAL_TABLET | Freq: Two times a day (BID) | ORAL | 0 refills | Status: DC
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
## 2021-05-26 NOTE — ED Triage Notes (Signed)
Pt presents with c/o dental pain and abscess x 5 days.   Pt states he thinks it is infected and his mom has given him Penicillin.

## 2021-05-26 NOTE — Discharge Instructions (Signed)
Start Augmentin twice daily for 7 days.  Gargle with warm salt water.  Use Tylenol ibuprofen for pain.  I do recommend you follow-up with a dentist for further evaluation and management as you will likely have recurrent symptoms until underlying tooth is addressed.  If you have any worsening symptoms including high fever, chest pain, shortness of breath, nausea/vomiting, difficulty swallowing, swelling of your throat you need to go to the emergency room.

## 2023-05-30 ENCOUNTER — Emergency Department (HOSPITAL_BASED_OUTPATIENT_CLINIC_OR_DEPARTMENT_OTHER)
Admission: EM | Admit: 2023-05-30 | Discharge: 2023-05-30 | Disposition: A | Discharge status: Home / Self Care | Payer: Self-pay | Attending: Emergency Medicine | Admitting: Emergency Medicine

## 2023-05-30 ENCOUNTER — Other Ambulatory Visit: Payer: Self-pay

## 2023-05-30 ENCOUNTER — Encounter (HOSPITAL_BASED_OUTPATIENT_CLINIC_OR_DEPARTMENT_OTHER): Payer: Self-pay

## 2023-05-30 DIAGNOSIS — Z20822 Contact with and (suspected) exposure to covid-19: Secondary | ICD-10-CM | POA: Insufficient documentation

## 2023-05-30 DIAGNOSIS — J101 Influenza due to other identified influenza virus with other respiratory manifestations: Secondary | ICD-10-CM | POA: Insufficient documentation

## 2023-05-30 LAB — GROUP A STREP BY PCR: Group A Strep by PCR: NOT DETECTED

## 2023-05-30 LAB — RESP PANEL BY RT-PCR (RSV, FLU A&B, COVID)  RVPGX2
Influenza A by PCR: POSITIVE — AB
Influenza B by PCR: NEGATIVE
Resp Syncytial Virus by PCR: NEGATIVE
SARS Coronavirus 2 by RT PCR: NEGATIVE

## 2023-05-30 MED ORDER — IBUPROFEN 800 MG PO TABS
800.0000 mg | ORAL_TABLET | Freq: Once | ORAL | Status: AC
  Administered 2023-05-30: 800 mg via ORAL
  Filled 2023-05-30: qty 1

## 2023-05-30 MED ORDER — ACETAMINOPHEN 325 MG PO TABS
650.0000 mg | ORAL_TABLET | Freq: Once | ORAL | Status: AC | PRN
  Administered 2023-05-30: 650 mg via ORAL
  Filled 2023-05-30: qty 2

## 2023-05-30 NOTE — ED Triage Notes (Signed)
 Patient here POV from Home.  Endorses Aches, Headache, Subjective Fever for about 24 hours.  NAD Noted during Triage. A&Ox4. GCS 15. Ambulatory.

## 2023-05-30 NOTE — Discharge Instructions (Addendum)
 Take 1000 mg of Tylenol  every 6 hours as needed for fever.  Take 400 mg ibuprofen  every 8 hours as needed for fever.  These medications are over-the-counter.

## 2023-05-30 NOTE — ED Provider Notes (Signed)
 [place] [facility] Provider Note   CSN: [id number] Arrival date & time: 05/30/23  1103     History  Chief Complaint  Patient presents with   Headache    William Molina is a 33 y.o. male.  Patient here with bodyaches and chills headache fever for 1 day.  Nothing makes it worse or better.  Denies any chest pain shortness of breath weakness numbness tingling.  No nausea vomit diarrhea.  Medical history.  The history is provided by the patient.       Home Medications Prior to Admission medications   Medication Sig Start Date End Date Taking? Authorizing Provider  amoxicillin -clavulanate (AUGMENTIN ) 875-125 MG tablet Take 1 tablet by mouth every 12 (twelve) hours. 05/26/21   Raspet, Erin K, PA-C      Allergies    Patient has no known allergies.    Review of Systems   Review of Systems  Physical Exam Updated Vital Signs BP (!) 145/75 (BP Location: Left Arm)   Pulse (!) 119   Temp (!) 103.4 F (39.7 C)   Resp 18   Ht 6' 1 (1.854 m)   Wt 115.2 kg   SpO2 95%   BMI 33.51 kg/m  Physical Exam Vitals and nursing note reviewed.  Constitutional:      General: He is not in acute distress.    Appearance: He is well-developed. He is not ill-appearing.  HENT:     Head: Normocephalic and atraumatic.     Mouth/Throat:     Mouth: Mucous membranes are moist.  Eyes:     Extraocular Movements: Extraocular movements intact.     Conjunctiva/sclera: Conjunctivae normal.     Pupils: Pupils are equal, round, and reactive to light.  Cardiovascular:     Rate and Rhythm: Normal rate and regular rhythm.     Heart sounds: Normal heart sounds. No murmur heard. Pulmonary:     Effort: Pulmonary effort is normal. No respiratory distress.     Breath sounds: Normal breath sounds.  Abdominal:     Palpations: Abdomen is soft.     Tenderness: There is no abdominal tenderness.  Musculoskeletal:        General: No swelling. Normal range of motion.      Cervical back: Normal range of motion and neck supple.  Skin:    General: Skin is warm and dry.     Capillary Refill: Capillary refill takes less than 2 seconds.  Neurological:     Mental Status: He is alert.  Psychiatric:        Mood and Affect: Mood normal.     ED Results / Procedures / Treatments   Labs (all labs ordered are listed, but only abnormal results are displayed) Labs Reviewed  RESP PANEL BY RT-PCR (RSV, FLU A&B, COVID)  RVPGX2 - Abnormal; Notable for the following components:      Result Value   Influenza A by PCR POSITIVE (*)    All other components within normal limits  GROUP A STREP BY PCR    EKG None  Radiology No results found.  Procedures Procedures    Medications Ordered in ED Medications  acetaminophen  (TYLENOL ) tablet 650 mg (650 mg Oral Given 05/30/23 1207)    ED Course/ Medical Decision Making/ A&P                                 Medical Decision Making  Risk OTC drugs.   William Molina is here with flulike symptoms.  Patient febrile tachycardic but very well-appearing.  Differential diagnosis likely flu symptoms only for 1 day.  Here breath sounds.  I have no concern for pneumonia at this time.  Is having some sore throat as well.  Will test for strep COVID flu RSV.  Patient positive for influenza A.  Recommend continue supportive care at home with Tylenol  and ibuprofen  and rest.  Discharged in good condition.  Understands return precautions.  This chart was dictated using voice recognition software.  Despite best efforts to proofread,  errors can occur which can change the documentation meaning.         Final Clinical Impression(s) / ED Diagnoses Final diagnoses:  Influenza A    Rx / DC Orders ED Discharge Orders     None         Ruthe Cornet, DO 05/30/23 1407

## 2024-02-06 ENCOUNTER — Encounter (HOSPITAL_COMMUNITY): Payer: Self-pay | Admitting: Emergency Medicine

## 2024-02-06 ENCOUNTER — Ambulatory Visit (HOSPITAL_COMMUNITY)
Admission: EM | Admit: 2024-02-06 | Discharge: 2024-02-06 | Disposition: A | Discharge status: Home / Self Care | Payer: Self-pay | Attending: Emergency Medicine | Admitting: Emergency Medicine

## 2024-02-06 ENCOUNTER — Other Ambulatory Visit: Payer: Self-pay

## 2024-02-06 ENCOUNTER — Emergency Department (HOSPITAL_COMMUNITY)
Admission: EM | Admit: 2024-02-06 | Discharge: 2024-02-07 | Disposition: A | Discharge status: Home / Self Care | Payer: Self-pay | Attending: Emergency Medicine | Admitting: Emergency Medicine

## 2024-02-06 ENCOUNTER — Encounter (HOSPITAL_COMMUNITY): Payer: Self-pay

## 2024-02-06 DIAGNOSIS — R1030 Lower abdominal pain, unspecified: Secondary | ICD-10-CM | POA: Insufficient documentation

## 2024-02-06 DIAGNOSIS — R1031 Right lower quadrant pain: Secondary | ICD-10-CM | POA: Insufficient documentation

## 2024-02-06 DIAGNOSIS — R1032 Left lower quadrant pain: Secondary | ICD-10-CM | POA: Insufficient documentation

## 2024-02-06 DIAGNOSIS — R1024 Suprapubic pain: Secondary | ICD-10-CM | POA: Insufficient documentation

## 2024-02-06 LAB — CBC
HCT: 42.3 % (ref 39.0–52.0)
Hemoglobin: 13.6 g/dL (ref 13.0–17.0)
MCH: 29.7 pg (ref 26.0–34.0)
MCHC: 32.2 g/dL (ref 30.0–36.0)
MCV: 92.4 fL (ref 80.0–100.0)
Platelets: 399 K/uL (ref 150–400)
RBC: 4.58 MIL/uL (ref 4.22–5.81)
RDW: 14.3 % (ref 11.5–15.5)
WBC: 8.7 K/uL (ref 4.0–10.5)
nRBC: 0 % (ref 0.0–0.2)

## 2024-02-06 LAB — CBC WITH DIFFERENTIAL/PLATELET
Abs Immature Granulocytes: 0.01 K/uL (ref 0.00–0.07)
Basophils Absolute: 0 K/uL (ref 0.0–0.1)
Basophils Relative: 1 %
Eosinophils Absolute: 0.1 K/uL (ref 0.0–0.5)
Eosinophils Relative: 1 %
HCT: 43.3 % (ref 39.0–52.0)
Hemoglobin: 14 g/dL (ref 13.0–17.0)
Immature Granulocytes: 0 %
Lymphocytes Relative: 45 %
Lymphs Abs: 2.6 K/uL (ref 0.7–4.0)
MCH: 29.4 pg (ref 26.0–34.0)
MCHC: 32.3 g/dL (ref 30.0–36.0)
MCV: 91 fL (ref 80.0–100.0)
Monocytes Absolute: 0.3 K/uL (ref 0.1–1.0)
Monocytes Relative: 6 %
Neutro Abs: 2.8 K/uL (ref 1.7–7.7)
Neutrophils Relative %: 47 %
Platelets: 391 K/uL (ref 150–400)
RBC: 4.76 MIL/uL (ref 4.22–5.81)
RDW: 14.1 % (ref 11.5–15.5)
WBC: 5.8 K/uL (ref 4.0–10.5)
nRBC: 0 % (ref 0.0–0.2)

## 2024-02-06 LAB — I-STAT CHEM 8, ED
BUN: 12 mg/dL (ref 6–20)
Calcium, Ion: 1.24 mmol/L (ref 1.15–1.40)
Chloride: 106 mmol/L (ref 98–111)
Creatinine, Ser: 1.1 mg/dL (ref 0.61–1.24)
Glucose, Bld: 114 mg/dL — ABNORMAL HIGH (ref 70–99)
HCT: 41 % (ref 39.0–52.0)
Hemoglobin: 13.9 g/dL (ref 13.0–17.0)
Potassium: 4.2 mmol/L (ref 3.5–5.1)
Sodium: 141 mmol/L (ref 135–145)
TCO2: 26 mmol/L (ref 22–32)

## 2024-02-06 LAB — POCT URINALYSIS DIP (MANUAL ENTRY)
Bilirubin, UA: NEGATIVE
Blood, UA: NEGATIVE
Glucose, UA: NEGATIVE mg/dL
Ketones, POC UA: NEGATIVE mg/dL
Leukocytes, UA: NEGATIVE
Nitrite, UA: NEGATIVE
Protein Ur, POC: NEGATIVE mg/dL
Spec Grav, UA: 1.025 (ref 1.010–1.025)
Urobilinogen, UA: 0.2 U/dL
pH, UA: 6.5 (ref 5.0–8.0)

## 2024-02-06 LAB — COMPREHENSIVE METABOLIC PANEL WITH GFR
ALT: 28 U/L (ref 0–44)
AST: 26 U/L (ref 15–41)
Albumin: 3.9 g/dL (ref 3.5–5.0)
Alkaline Phosphatase: 88 U/L (ref 38–126)
Anion gap: 14 (ref 5–15)
BUN: 10 mg/dL (ref 6–20)
CO2: 23 mmol/L (ref 22–32)
Calcium: 9.3 mg/dL (ref 8.9–10.3)
Chloride: 101 mmol/L (ref 98–111)
Creatinine, Ser: 1.01 mg/dL (ref 0.61–1.24)
GFR, Estimated: >60 mL/min (ref >60)
Glucose, Bld: 132 mg/dL — ABNORMAL HIGH (ref 70–99)
Potassium: 4.3 mmol/L (ref 3.5–5.1)
Sodium: 138 mmol/L (ref 135–145)
Total Bilirubin: 0.5 mg/dL (ref 0.0–1.2)
Total Protein: 7.2 g/dL (ref 6.5–8.1)

## 2024-02-06 MED ORDER — MORPHINE SULFATE (PF) 4 MG/ML IV SOLN
4.0000 mg | Freq: Once | INTRAVENOUS | Status: AC
  Administered 2024-02-06: 4 mg via INTRAVENOUS
  Filled 2024-02-06: qty 1

## 2024-02-06 MED ORDER — ONDANSETRON HCL 4 MG/2ML IJ SOLN
4.0000 mg | Freq: Once | INTRAMUSCULAR | Status: AC
  Administered 2024-02-06: 4 mg via INTRAVENOUS
  Filled 2024-02-06: qty 2

## 2024-02-06 MED ORDER — DICYCLOMINE HCL 20 MG PO TABS: 20.0000 mg | ORAL_TABLET | Freq: Two times a day (BID) | ORAL | 0 refills | Status: AC

## 2024-02-06 MED ORDER — SODIUM CHLORIDE 0.9 % IV BOLUS
1000.0000 mL | Freq: Once | INTRAVENOUS | Status: AC
  Administered 2024-02-06: 1000 mL via INTRAVENOUS
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
## 2024-02-06 NOTE — ED Triage Notes (Signed)
 PT states he is having ABD pain bilat. Lower. Was seen at UC earlier today was told they couldn't find anything wrong but states pain is getting worse. Pain started about one week ago. Denies n/v urine or bowel issues.

## 2024-02-06 NOTE — Discharge Instructions (Signed)
 We have drawn some labs today to evaluate your abdominal pain.  If any of these results are concerning someone will call and advise treatment at that time. You can take Bentyl twice daily as needed for abdominal pain. You can also alternate between 650 mg of Tylenol  and 400 to 600 mg of ibuprofen  needed for pain. If you develop worsening abdominal pain, excessive vomiting, excessive diarrhea, blood in vomit or stool, or fever please seek immediate medical treatment in the emergency department. Otherwise you can follow-up with the community [facility] wellness center if your symptoms continue or recur. Return here as needed.

## 2024-02-06 NOTE — ED Triage Notes (Signed)
 PT REPORTS for several days having lower abd pains all the way across. Denies vomiting, nausea, urinary or bowel problems.

## 2024-02-06 NOTE — ED Provider Notes (Signed)
 [place] EMERGENCY DEPARTMENT AT [facility] Provider Note   CSN: [id number] Arrival date & time: 02/06/24  2054     Patient presents with: Abdominal Pain   William Molina is a 33 y.o. male.   33 yo male with lower abdominal pain x 1 week. Pain was intermittent at first, now more bothersome and consistent for the past 2 days. Pain is worse with sitting/standing. Last bowel movement today (normal, non bloody). No fever, no changes in bowel or bladder habits. No prior abdominal surgeries, denies nausea, vomiting, no testicle pain.  Seen at [facility] today, tried Bentyl without any relief.        Prior to Admission medications   Medication Sig Start Date End Date Taking? Authorizing Provider  dicyclomine (BENTYL) 20 MG tablet Take 1 tablet (20 mg total) by mouth 2 (two) times daily. 02/06/24   Johnie Rumaldo LABOR, NP    Allergies: Patient has no known allergies.    Review of Systems Negative except as per HPI Updated Vital Signs BP (!) 155/101   Pulse 66   Temp 98.8 F (37.1 C)   Resp 18   Ht 6' 1 (1.854 m)   Wt 115.2 kg   SpO2 98%   BMI 33.51 kg/m   Physical Exam Vitals and nursing note reviewed.  Constitutional:      General: He is not in acute distress.    Appearance: He is well-developed. He is not diaphoretic.  HENT:     Head: Normocephalic and atraumatic.  Cardiovascular:     Rate and Rhythm: Normal rate and regular rhythm.     Heart sounds: Normal heart sounds.  Pulmonary:     Effort: Pulmonary effort is normal.  Abdominal:     Palpations: Abdomen is soft.     Tenderness: There is abdominal tenderness in the right lower quadrant, suprapubic area and left lower quadrant. There is no right CVA tenderness or left CVA tenderness.  Skin:    General: Skin is warm and dry.     Findings: No erythema or rash.  Neurological:     Mental Status: He is alert and oriented to person, place, and time.  Psychiatric:        Behavior: Behavior normal.      (all labs ordered are listed, but only abnormal results are displayed) Labs Reviewed  URINALYSIS, ROUTINE W REFLEX MICROSCOPIC - Abnormal; Notable for the following components:      Result Value   Specific Gravity, Urine 1.032 (*)    All other components within normal limits  I-STAT CHEM 8, ED - Abnormal; Notable for the following components:   Glucose, Bld 114 (*)    All other components within normal limits  CBC    EKG: None  Radiology: CT ABDOMEN PELVIS W CONTRAST Result Date: 02/07/2024 EXAM: CT ABDOMEN AND PELVIS WITH CONTRAST 02/07/2024 01:43:33 AM TECHNIQUE: CT of the abdomen and pelvis was performed with the administration of 100 mL of iohexol  (OMNIPAQUE ) 300 MG/ML solution. Multiplanar reformatted images are provided for review. Automated exposure control, iterative reconstruction, and/or weight-based adjustment of the mA/kV was utilized to reduce the radiation dose to as low as reasonably achievable. COMPARISON: 04/09/2014 CLINICAL HISTORY: Abdominal pain, acute, nonlocalized. ABD pain bilat. Lower. Was seen at UC earlier today was told they couldn't find anything wrong but states pain is getting worse. Pain started about one week ago. Notes from chart, GFR>60; 100 ml omni 300. FINDINGS: LOWER CHEST: No acute abnormality. LIVER: The liver is  unremarkable. GALLBLADDER AND BILE DUCTS: Gallbladder is unremarkable. No biliary ductal dilatation. SPLEEN: No acute abnormality. PANCREAS: No acute abnormality. ADRENAL GLANDS: No acute abnormality. KIDNEYS, URETERS AND BLADDER: No stones in the kidneys or ureters. No hydronephrosis. No perinephric or periureteral stranding. Urinary bladder is unremarkable. GI AND BOWEL: Stomach demonstrates no acute abnormality. There is no bowel obstruction. Normal appendix (image 59). PERITONEUM AND RETROPERITONEUM: No ascites. No free air. VASCULATURE: Aorta is normal in caliber. LYMPH NODES: No lymphadenopathy. REPRODUCTIVE ORGANS: Prostate is  unremarkable. BONES AND SOFT TISSUES: No acute osseous abnormality. No focal soft tissue abnormality. IMPRESSION: 1. No acute findings in the abdomen or pelvis. Electronically signed by: Pinkie Pebbles MD 02/07/2024 01:48 AM EDT RP Workstation: [id number]     Procedures   Medications Ordered in the ED  ondansetron  (ZOFRAN ) injection 4 mg (4 mg Intravenous Given 02/06/24 2315)  sodium chloride  0.9 % bolus 1,000 mL (0 mLs Intravenous Stopped 02/07/24 0043)  morphine  (PF) 4 MG/ML injection 4 mg (4 mg Intravenous Given 02/06/24 2316)  iohexol  (OMNIPAQUE ) 300 MG/ML solution 100 mL (100 mLs Intravenous Contrast Given 02/07/24 0126)                                    Medical Decision Making Amount and/or Complexity of Data Reviewed Labs: ordered. Radiology: ordered.  Risk Prescription drug management.   This patient presents to the ED for concern of lower abdominal pain, this involves an extensive number of treatment options, and is a complaint that carries with it a high risk of complications and morbidity.  The differential diagnosis includes but not limited to appendicitis, colitis, constipation, diverticulitis   Co morbidities / Chronic conditions that complicate the patient evaluation  No significant past medical history   Additional history obtained:  Additional history obtained from EMR External records from outside source obtained and reviewed including labs obtained in urgent care today including chemistry CBC urinalysis   Lab Tests:  I Ordered, and personally interpreted labs.  The pertinent results include: Urinalysis is unremarkable.  I-STAT Chem-8 without significant findings.  CBC normal.   Imaging Studies ordered:  I ordered imaging studies including CT abdomen pelvis I independently visualized and interpreted imaging which showed no acute process I agree with the radiologist interpretation   Problem List / ED Course / Critical interventions / Medication  management  33 year old male with complaint of lower abdominal pain for the past week, worse for the past day.  Trialed Bentyl from urgent care without improvement.  Labs without significant findings.  CT without findings to explain patient's pain.  He denies testicular pain.  Recommend follow-up with PCP/GI and return to ER for worsening or concerning symptoms. I ordered medication including morphine , zofran    Reevaluation of the patient after these medicines showed that the patient pain improved I have reviewed the patients home medicines and have made adjustments as needed    Social Determinants of Health:  No PCP on file   Test / Admission - Considered:  Stable for dc      Final diagnoses:  Lower abdominal pain    ED Discharge Orders     None          Beverley Leita LABOR, PA-C 02/07/24 0419    Bari Charmaine FALCON, MD 02/08/24 [phone]

## 2024-02-07 ENCOUNTER — Encounter (HOSPITAL_COMMUNITY): Payer: Self-pay

## 2024-02-07 ENCOUNTER — Emergency Department (HOSPITAL_COMMUNITY): Payer: Self-pay

## 2024-02-07 LAB — URINALYSIS, ROUTINE W REFLEX MICROSCOPIC
Bilirubin Urine: NEGATIVE
Glucose, UA: NEGATIVE mg/dL
Hgb urine dipstick: NEGATIVE
Ketones, ur: NEGATIVE mg/dL
Leukocytes,Ua: NEGATIVE
Nitrite: NEGATIVE
Protein, ur: NEGATIVE mg/dL
Specific Gravity, Urine: 1.032 — ABNORMAL HIGH (ref 1.005–1.030)
pH: 5 (ref 5.0–8.0)

## 2024-02-07 MED ORDER — IOHEXOL 300 MG/ML  SOLN
100.0000 mL | Freq: Once | INTRAMUSCULAR | Status: AC | PRN
Start: 2024-02-07 — End: 2024-02-07
  Administered 2024-02-07: 100 mL via INTRAVENOUS

## 2024-02-07 NOTE — Discharge Instructions (Addendum)
 Follow-up with GI if symptoms persist. Recheck with primary care provider.  If you do not have a primary care provider, please contact [facility]. Return to the ER for worsening or concerning symptoms.

## 2024-02-07 NOTE — ED Notes (Signed)
 Patient back from CT

## 2024-02-07 NOTE — ED Notes (Signed)
 Sent down recollect (2 tubes) for the 4th time.

## 2024-02-08 ENCOUNTER — Ambulatory Visit (HOSPITAL_COMMUNITY): Payer: Self-pay
